# Patient Record
Sex: Male | Born: 2000 | Race: White | Hispanic: No | Marital: Single | Smoking: Never smoker
Health system: Southern US, Community
[De-identification: ages and names within clinical notes are randomized; demographics above are authoritative.]

---
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
## 2018-02-13 ENCOUNTER — Encounter: Payer: Self-pay | Admitting: Physician Assistant

## 2018-07-27 IMAGING — DX DG WRIST COMPLETE 3+V*R*
4 series · 4 of 4 positions shown · non-contrast
Comparison: None.

CLINICAL DATA: Right wrist pain

EXAM:
RIGHT WRIST - COMPLETE 3+ VIEW

[wrist pa]
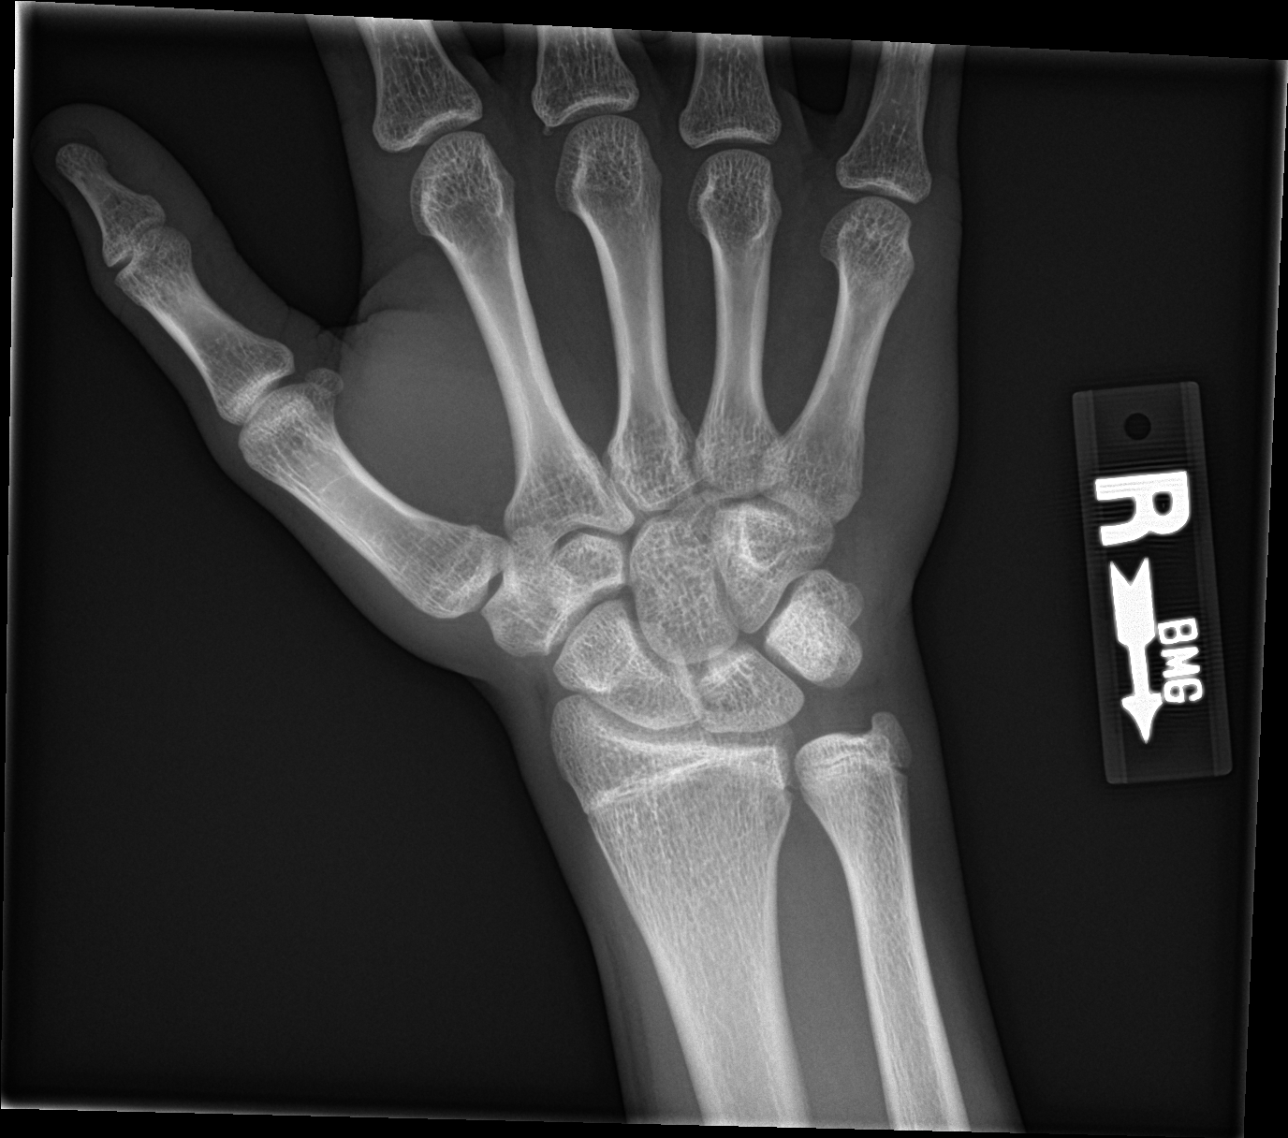

[wrist obl]
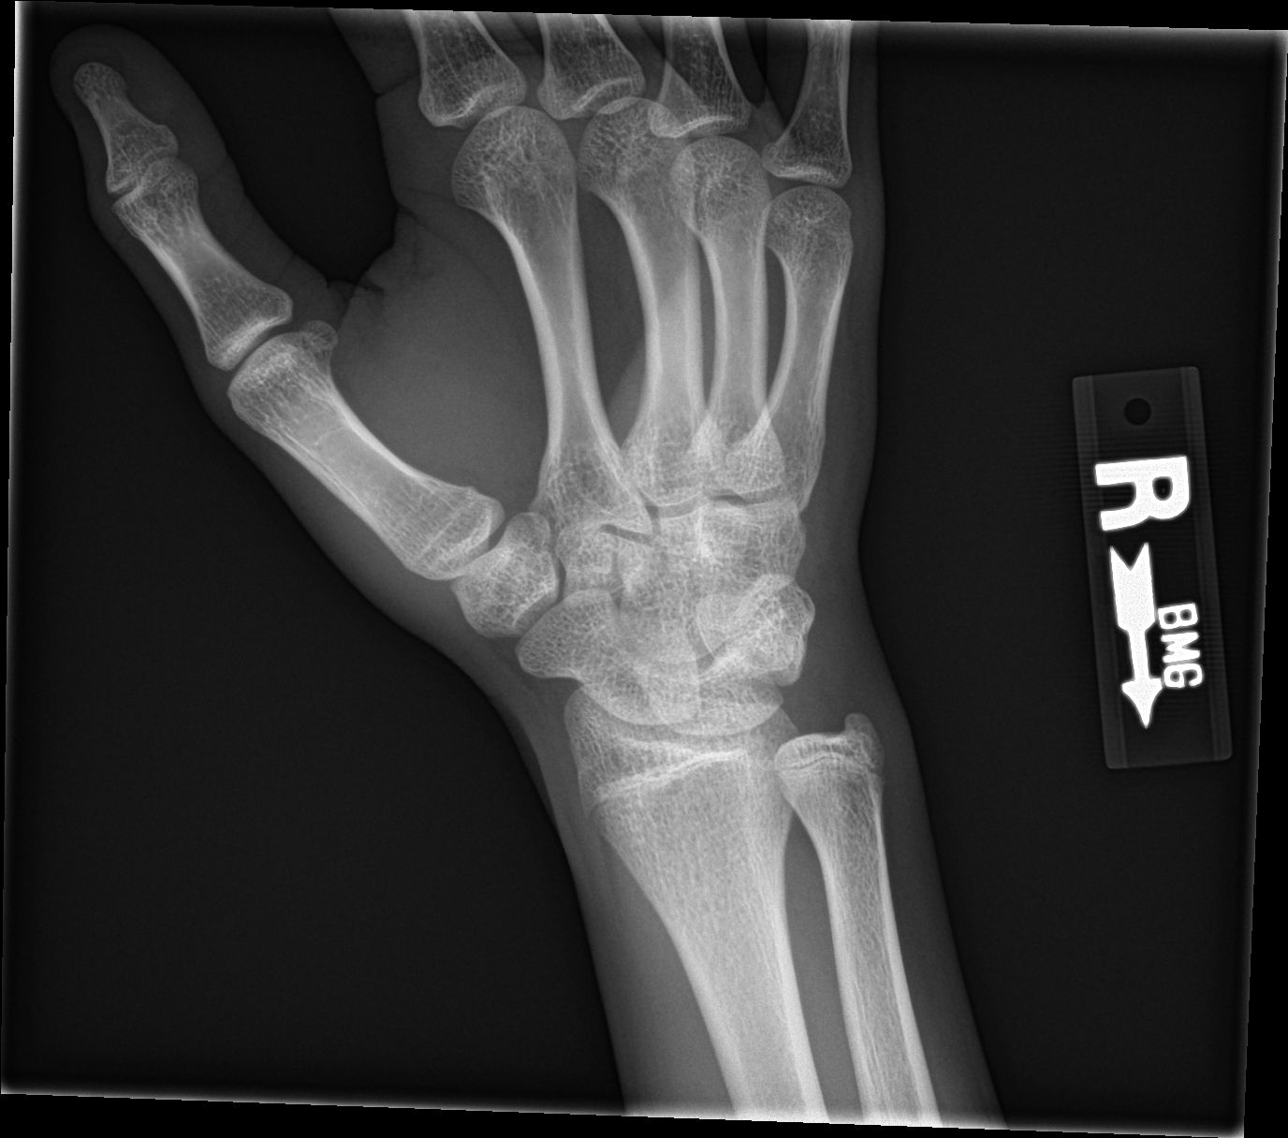

[wrist lat]
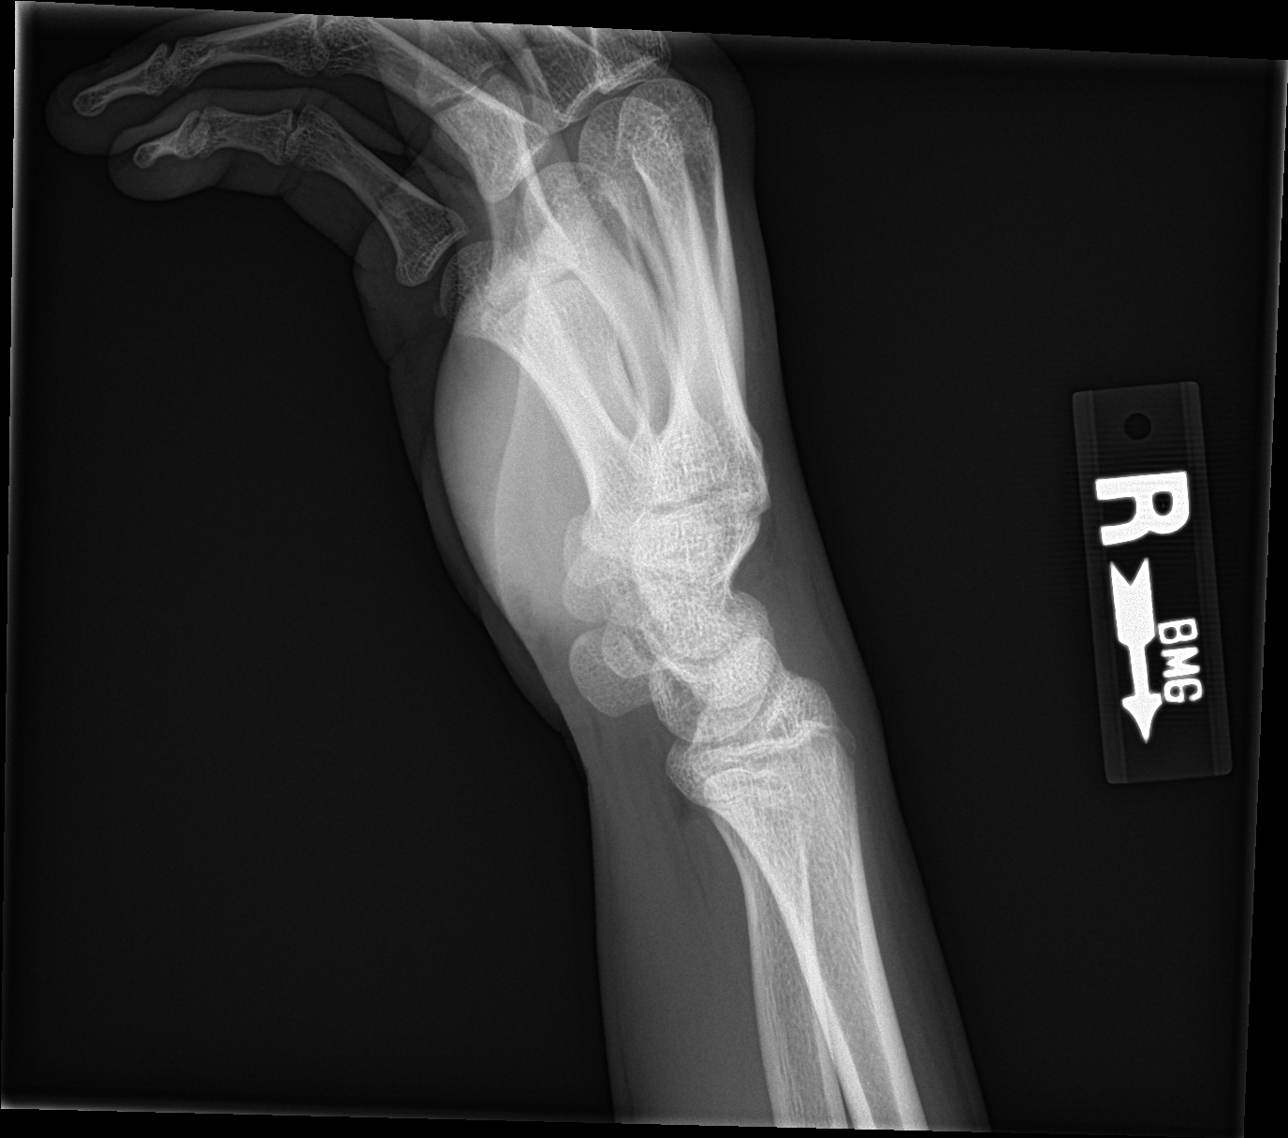

[wrist navicular]
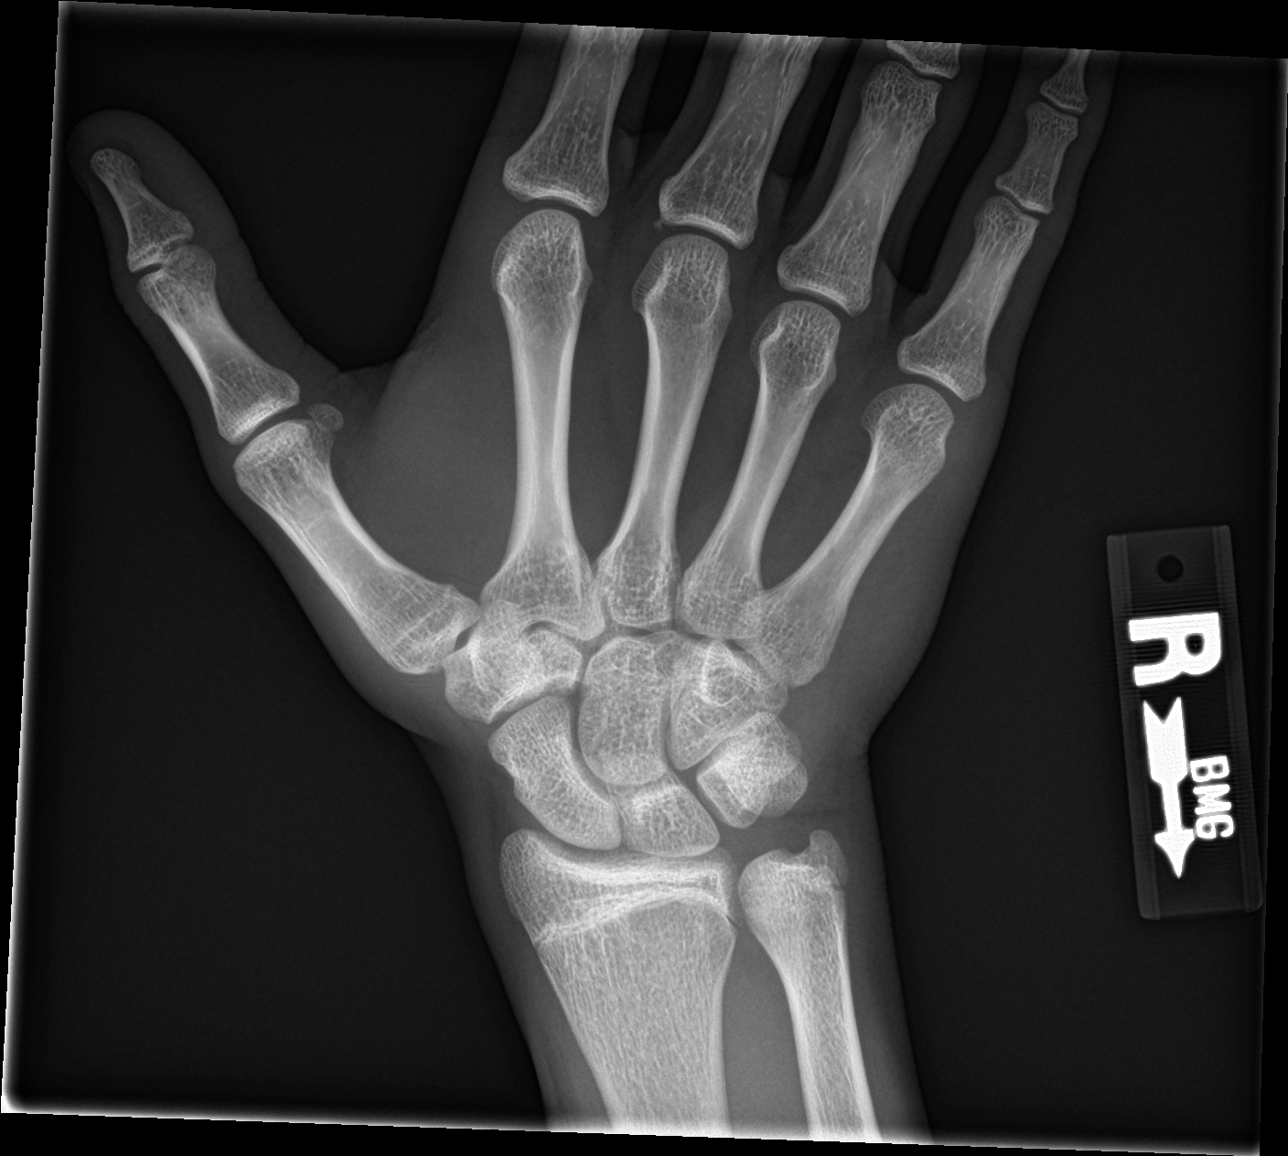

[4 of 4 positions shown; findings below may reference images not displayed]

FINDINGS: There is no evidence of fracture or dislocation. There is no
evidence of arthropathy or other focal bone abnormality. Soft
tissues are unremarkable.
IMPRESSION: Negative.

## 2023-06-16 DEATH — deceased
